# Patient Record
Sex: Female | Born: 1970 | Race: Black or African American | Hispanic: No | Marital: Married | Smoking: Never smoker
Health system: Southern US, Community
[De-identification: ages and names within clinical notes are randomized; demographics above are authoritative.]

## PROBLEM LIST (undated history)

## (undated) DIAGNOSIS — M79605 Pain in left leg: Secondary | ICD-10-CM

## (undated) DIAGNOSIS — M25552 Pain in left hip: Secondary | ICD-10-CM

## (undated) DIAGNOSIS — M25569 Pain in unspecified knee: Secondary | ICD-10-CM

## (undated) HISTORY — DX: Pain in unspecified knee: M25.569

## (undated) HISTORY — DX: Pain in left hip: M25.552

## (undated) HISTORY — PX: OTHER SURGICAL HISTORY: SHX169

## (undated) HISTORY — DX: Pain in left leg: M79.605

---

## 2005-06-21 ENCOUNTER — Ambulatory Visit: Payer: Self-pay | Admitting: *Deleted

## 2005-06-22 ENCOUNTER — Ambulatory Visit (HOSPITAL_COMMUNITY): Admission: RE | Admit: 2005-06-22 | Discharge: 2005-06-22 | Payer: Self-pay | Admitting: *Deleted

## 2008-03-19 ENCOUNTER — Encounter: Admission: RE | Admit: 2008-03-19 | Discharge: 2008-03-19 | Payer: Self-pay | Admitting: Neurological Surgery

## 2008-03-25 ENCOUNTER — Encounter: Admission: RE | Admit: 2008-03-25 | Discharge: 2008-03-25 | Payer: Self-pay | Admitting: Neurological Surgery

## 2008-07-29 ENCOUNTER — Inpatient Hospital Stay (HOSPITAL_COMMUNITY): Admission: AD | Admit: 2008-07-29 | Discharge: 2008-07-29 | Payer: Self-pay | Admitting: Obstetrics & Gynecology

## 2008-09-01 ENCOUNTER — Ambulatory Visit: Payer: Self-pay | Admitting: Obstetrics and Gynecology

## 2008-09-02 ENCOUNTER — Encounter: Payer: Self-pay | Admitting: Obstetrics and Gynecology

## 2008-09-02 LAB — CONVERTED CEMR LAB
RBC: 3.78 M/uL — ABNORMAL LOW (ref 3.87–5.11)
TSH: 5.808 microintl units/mL — ABNORMAL HIGH (ref 0.350–4.500)
WBC: 6.1 10*3/uL (ref 4.0–10.5)

## 2008-09-10 ENCOUNTER — Ambulatory Visit (HOSPITAL_COMMUNITY): Admission: RE | Admit: 2008-09-10 | Discharge: 2008-09-10 | Payer: Self-pay | Admitting: Obstetrics & Gynecology

## 2008-09-22 ENCOUNTER — Ambulatory Visit: Payer: Self-pay | Admitting: Obstetrics and Gynecology

## 2008-09-23 ENCOUNTER — Encounter: Payer: Self-pay | Admitting: Obstetrics & Gynecology

## 2008-09-23 LAB — CONVERTED CEMR LAB: T4, Total: 8.1 ug/dL (ref 5.0–12.5)

## 2008-10-29 ENCOUNTER — Ambulatory Visit: Payer: Self-pay | Admitting: Obstetrics and Gynecology

## 2008-12-10 ENCOUNTER — Ambulatory Visit: Payer: Self-pay | Admitting: Obstetrics and Gynecology

## 2008-12-10 LAB — CONVERTED CEMR LAB: TSH: 0.063 microintl units/mL — ABNORMAL LOW (ref 0.350–4.500)

## 2008-12-16 ENCOUNTER — Ambulatory Visit: Payer: Self-pay | Admitting: Obstetrics and Gynecology

## 2008-12-16 LAB — CONVERTED CEMR LAB: Free T4: 1.36 ng/dL (ref 0.80–1.80)

## 2009-01-26 ENCOUNTER — Ambulatory Visit: Payer: Self-pay | Admitting: Obstetrics & Gynecology

## 2009-01-26 ENCOUNTER — Encounter: Payer: Self-pay | Admitting: Obstetrics and Gynecology

## 2009-01-26 LAB — CONVERTED CEMR LAB: TSH: 0.557 microintl units/mL (ref 0.350–4.500)

## 2010-04-08 IMAGING — RF IR MYELOGRAM [PERSON_NAME]
12 of 22 series · 12 of 22 positions shown · non-contrast
Comparison: none

CLINICAL DATA: Back and neck pain.
TECHNIQUE: Contiguous axial images were obtained through the
Cervical spine without infusion. Coronal and sagittal
reconstructions were obtained of the axial image sets.
TECHNIQUE: Contiguous axial images were obtained through the lumbar
spine without infusion. Coronal, sagittal, and disc space

[Series 1: (hospital) · 1 of 1 slices shown]
[im 1/1]
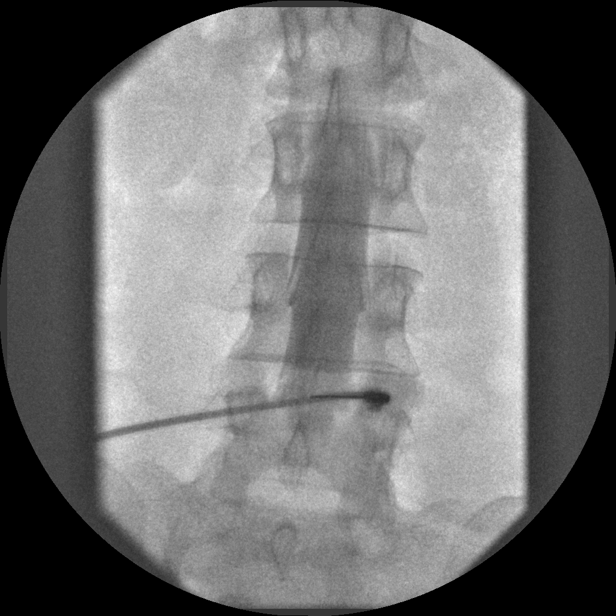

[Series 3: myelogram  white · 1 of 1 slices shown (1 of 11)]
[im 1/1]
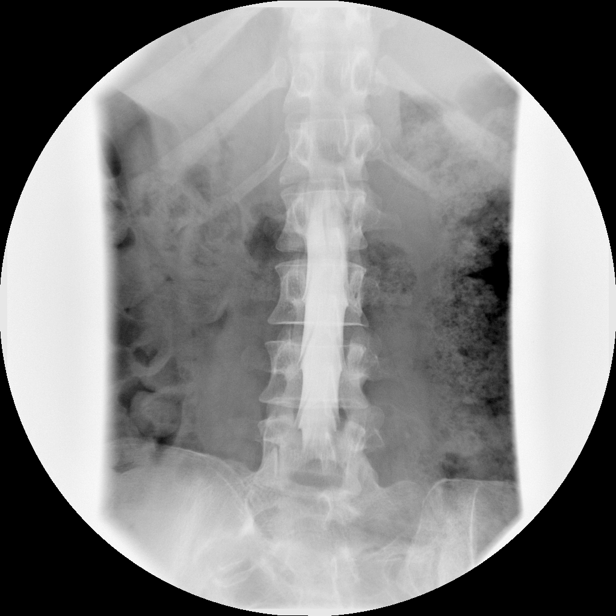

[Series 5: myelogram  white · 1 of 1 slices shown (2 of 11)]
[im 1/1]
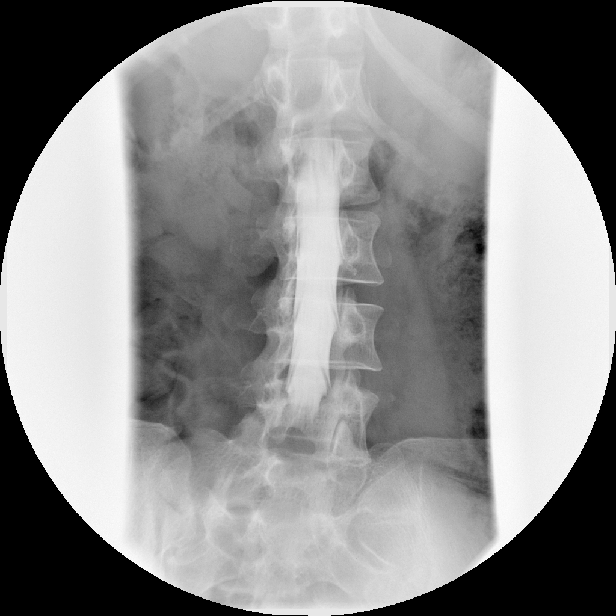

[Series 7: myelogram  white · 1 of 1 slices shown (3 of 11)]
[im 1/1]
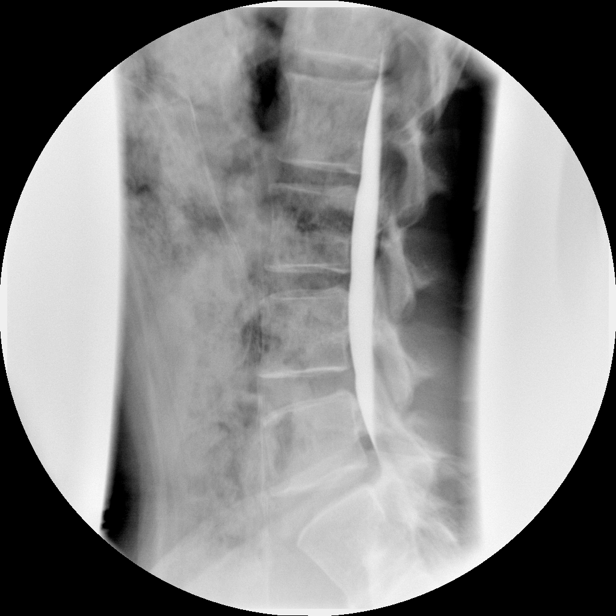

[Series 9: myelogram  white · 1 of 1 slices shown (4 of 11)]
[im 1/1]
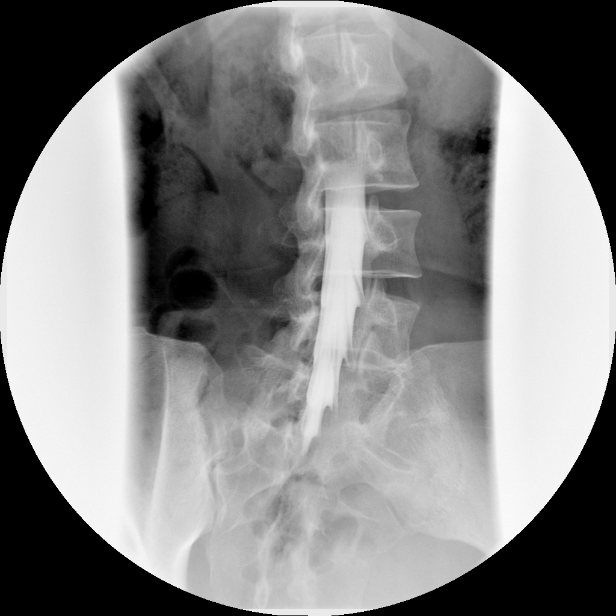

[Series 11: myelogram  white · 1 of 1 slices shown (5 of 11)]
[im 1/1]
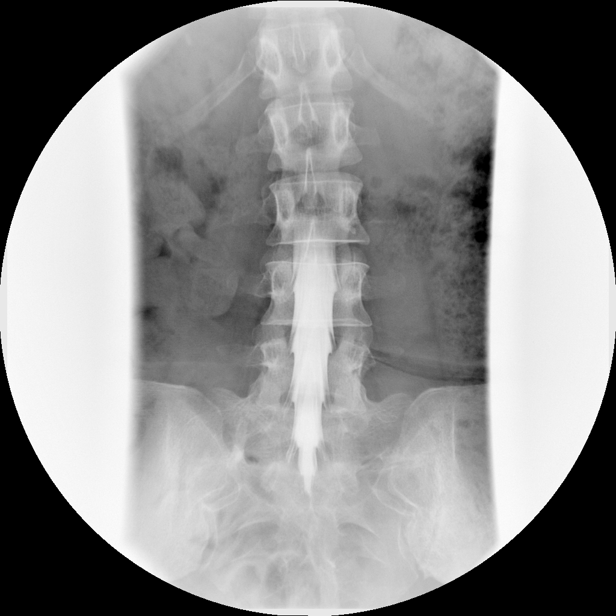

[Series 12: myelogram  white · 1 of 1 slices shown (6 of 11)]
[im 1/1]
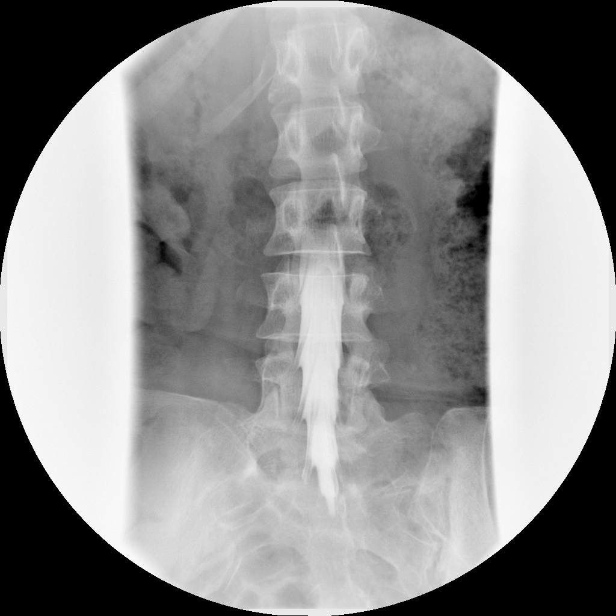

[Series 14: myelogram  white · 1 of 1 slices shown (7 of 11)]
[im 1/1]
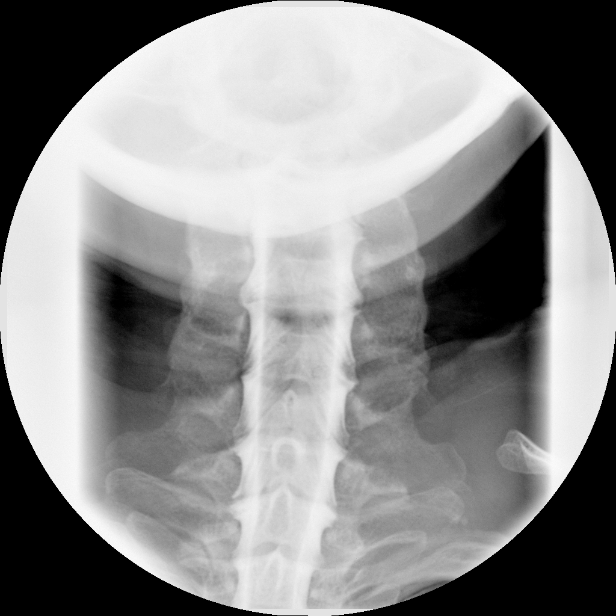

[Series 16: myelogram  white · 1 of 1 slices shown (8 of 11)]
[im 1/1]
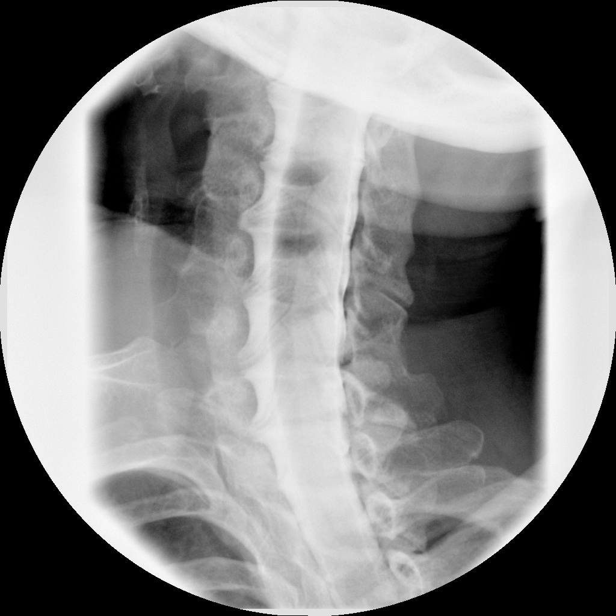

[Series 18: myelogram  white · 1 of 1 slices shown (9 of 11)]
[im 1/1]
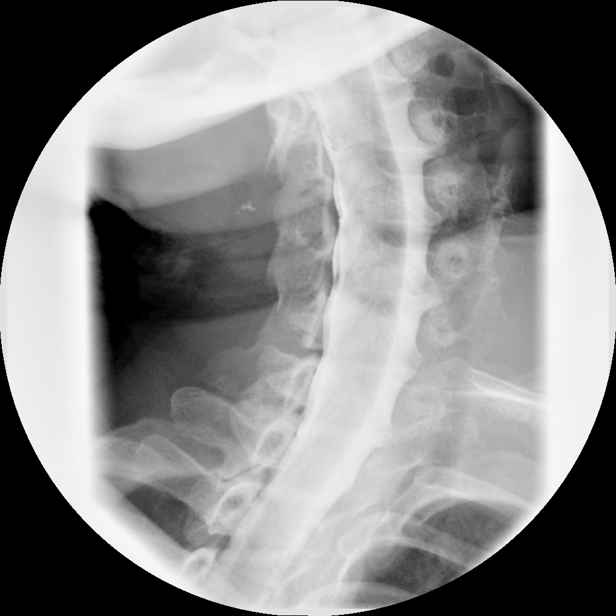

[Series 20: myelogram  white · 1 of 1 slices shown (10 of 11)]
[im 1/1]
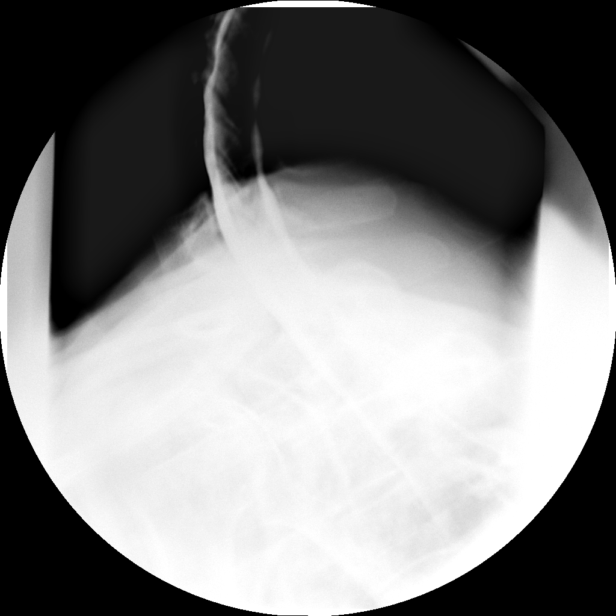

[Series 22: myelogram  white · 1 of 1 slices shown (11 of 11)]
[im 1/1]
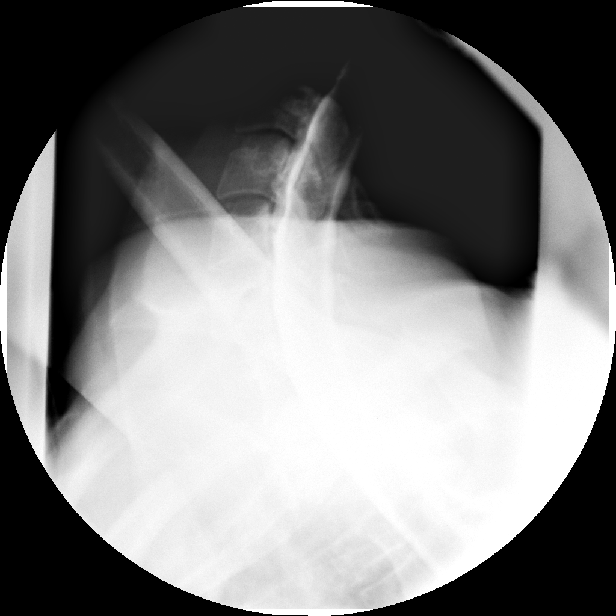

[12 of 22 positions shown; findings below may reference images not displayed]

LUMBAR PUNCTURE FOR CERVICAL MYELOGRAM

Procedure: After thorough discussion of risks and benefits of the
procedure including bleeding, infection, injury to nerves, blood
vessels, adjacent structures as well as headache and CSF leak,
written and oral informed consent was obtained.   Consent was
obtained by Dr.Adolfo.

Patient was positioned prone on the fluoroscopy table. Local
anesthesia was provided with 1% lidocaine without epinephrine after
prepped and draped in the usual sterile fashion. Puncture was
performed at L3-L4 using a three and one half inch 22-gauge spinal
needle via right paramedian approach.  Using a single pass through
the dura, the needle was placed within the thecal sac, with return
of clear CSF. 8 mL of Dmnipaque-ZPP was injected into the thecal
sac, with normal opacification of the nerve roots and cauda equina
consistent with free flow within the subarachnoid space.

Fluoroscopy time: 1 minute 57 seconds
FINDINGS: Cervical spinal alignment is anatomic.  No extradural
filling defects are identified.  Disc spaces preserved.
IMPRESSION: Negative cervical myelogram.

CT CERVICAL MYELOGRAM:
FINDINGS: Alignment of the cervical spine shows a mild
straightening of the mid cervical lordosis.  The cervical cord
demonstrates a normal caliber without enlargement or myelomalacia.
Bones demonstrate normal mineralization without fracture.  There is
a sclerotic lesion in the right T1 vertebral body adjacent to the
pedicle which at this margins that line with the adjacent
trabecula, most consistent with a large bone island.  This measures
6 mm by 6 mm.

Craniocervical alignment is normal.  Atlantodental interval
unremarkable.

C2-C3: Negative.

C3-C4:  Minimal central disc bulge without stenosis.

C4-C5:  Negative.

C5-C6:  Mild loss of disc height with minimal central bulging but
no stenosis.

C6-C7:  Negative.

C7-T1:  Negative.
IMPRESSION: Minimal cervical spine degenerative disease with small disc bulges
at C3-C4 and C5-C6 without stenosis.

LUMBAR MYELOGRAM
FINDINGS: Lumbosacral transitional anatomy is present with
extensive sacralization of the L5 vertebral body and transverse
process and a rudimentary L5-S1 intervertebral disc.  There is no
amputation of nerve roots on plain film myelography.  Mild
dextroconvex lumbar scoliosis is present which appears positional
on several images. Mild L4-L5 anterior epidural impression
consistent with shallow disc bulge.
IMPRESSION: 1.  Lumbosacral transitional anatomy with extensive sacralization
of the L5 vertebra which is partially fused on the left.
2.  No significant spondylolisthesis and minimal spondylosis with
shallow broad-based disc bulge at L3-L4.

CT LUMBAR MYELOGRAM
FINDINGS: The paraspinal soft tissues appear within normal limits.
Lumbosacral transitional anatomy is  with rudimentary L5-S1 disc
space.  The left L5 transverse processes fused to the sacrum.
Right L5 transverse process shows a pseudoarthrosis with the sacrum
with mild sclerotic changes which could be associated with painful
pseudoarthrosis or Bertolotti's syndrome.  At the time of CT
scanning, the alignment shows a minimal dextroconvex lumbar
scoliosis.  Spinal cord terminates posterior the L1 vertebral body.
T11-T12  is normal.

T12-L1: Negative.

L1-L2: Negative.

L2-L3: Negative.

L3-L4: Minimal shallow broad-based posterior disc bulge without
significant impression on the thecal sac.  Neural foramina and
lateral recesses as well as the central canal appear patent.
Facets within normal limits.

L4-L5: Degenerated disc with broad-based posterior disc bulge and
central moderate-sized protrusion.  This produces mass effect on
both lateral recesses, left greater than right.  This is best seen
on axial image number 61 series 3 and also on sagittal
reconstructions.  There is probably extrusion with cranial and
caudal extension measuring a few millimeters.  Mild central
stenosis.  No definite foraminal narrowing is present despite disc
bulging into both foramina.

L5-S1: Rudimentary disc with anterior disc calcification.  No
fusion is present at this level across the disc space.  Facet
joints are diminutive with some mild right ligamentum flavum
calcification.  Lateral recesses and central canal patent.
IMPRESSION: 1.  Lumbosacral transitional anatomy is present with four lumbar-
type non-rib bearing vertebral bodies and a lumbosacral
transitional vertebra which is termed L5.  This has extensive
sacralization of the transverse processes and is fused to the
sacrum on the left with pseudoarthrosis on the right.  Mild
sclerosis of the pseudoarthrosis which could be associated with
Bertolotti's syndrome.
2.  Moderate central disc protrusion, with probable extrusion at L4-
L5 and a few millimeters of cranial and caudal extension.
Bilateral lateral recess stenosis potentially effecting both L5
nerve roots in the lateral recesses.  Mild central stenosis.

## 2010-07-30 LAB — POCT URINALYSIS DIP (DEVICE)
Bilirubin Urine: NEGATIVE
Hgb urine dipstick: NEGATIVE
Nitrite: NEGATIVE
Specific Gravity, Urine: 1.015 (ref 1.005–1.030)

## 2010-08-01 LAB — POCT PREGNANCY, URINE: Preg Test, Ur: NEGATIVE

## 2010-08-02 LAB — URINALYSIS, ROUTINE W REFLEX MICROSCOPIC
Bilirubin Urine: NEGATIVE
Glucose, UA: NEGATIVE mg/dL
Hgb urine dipstick: NEGATIVE
Ketones, ur: NEGATIVE mg/dL
pH: 6 (ref 5.0–8.0)

## 2010-09-05 NOTE — Group Therapy Note (Signed)
NAMESHAMARA, Ann Estrada              ACCOUNT NO.:  [id number]   MEDICAL RECORD NO.:  [id number]          PATIENT TYPE:  WOC   LOCATION:  [facility]                   FACILITY:  [facility]   PHYSICIAN:  Allie Bossier, MD        DATE OF BIRTH:  03-10-1971   DATE OF SERVICE:  09/22/2008                                  CLINIC NOTE   HISTORY:  [name] is a 40 year old [place] lady who is G5, P 5,  who is here for follow up of her pelvic pain.  She says that beginning  around March, she has been having irregular vaginal bleeding, mostly  bleeding on a daily pattern, although it is very light.  Her hemoglobin  done just recently was 11.9.  She also complains of pain in the  suprapubic area.  On exam with Dr. Okey Dupre 09/01/2008, he describes her  uterus to be of normal size and shape, and normal consistency, but  quite tender to palpation.  An ultrasound was done which was described  as normal.  I agree with his assessment that she probably has  adenomyosis.  I have offered her and given her written information on  Depo-Provera and the Mirena IUD.  I have discussed a hysterectomy with  her.   At this time, she is going to go home and research these options.  She  will come back and let us know what she wants to do.  Please note that  she currently only takes an occasional Advil or Tylenol for shoulder  pain.  She says that helps somewhat with her pelvic pain, but she does  not take it on a regular basis.  I have assured her that she does not  have any obvious malignancies and that this condition can be observed.  Of note, her TSH was drawn on 09/01/2008 and was elevated at 5.8.  I  have ordered a full thyroid panel.  With further history taking, she  tells me that 3 years ago she was told she had a low thyroid and was  given thyroid medicine.  However, she has not taken that medicine and  does not seem excited about the prospect of taking medicine for it.  However, she has agreed to have the  blood work drawn today.      Allie Bossier, MD     MCD/MEDQ  D:  09/22/2008  T:  09/22/2008  Job:  [id number]

## 2010-09-05 NOTE — Group Therapy Note (Signed)
Ann Estrada, Ann Estrada              ACCOUNT NO.:  [id number]   MEDICAL RECORD NO.:  [id number]          PATIENT TYPE:  WOC   LOCATION:  [facility]                   FACILITY:  [facility]   PHYSICIAN:  Argentina Donovan, MD        DATE OF BIRTH:  28-Apr-1970   DATE OF SERVICE:  09/01/2008                                  CLINIC NOTE   The patient is a 40 year old [place] female who speaks English and has  been in this country for 19 years.  She is a gravida 5, para 5-0-0-5,  with her youngest child 23 years old.  Although she has had some problem  with constipation, having only a bowel movement every 2 days for many  years, recently her main problem of our concern is her gynecological  problem which is that she, starting in about March, has had almost  continual bleeding with maybe a day or to stopping.  The bleeding has  only required her to change her pad once or twice a day, but she has  also had lower abdominal pain for about 6 months.  When her child jumps  on her belly or whatever, it always is referred to the suprapubic area.  She was recently seen in the clinic and has been put on amoxicillin for  a throat infection.  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  It sounds as if she probably has a radiculopathy of the cervical  vertebra and also perhaps a sciatica.  Both of those things should be on  her right side, and she is seeing orthopedic doctors and is going now  for pain management.   As far as a gynecological examination goes, her abdomen is soft, flat,  somewhat tender but no guarding or rebound, and this is only to deep  palpation.  The abdominal wall is very soft, although, when she attempts  to sit up, it shows that she has fairly good musculature.  The pain is  mostly directly over the symphysis.  On examination pelvically,  external  genitalia is status female circumcision.  The introitus otherwise is  normal.  The vagina is clean with a moderate amount of blood in the  vault and well rugated, somewhat swollen at the anterior wall, but soft  and no sign of any abscess and no tenderness there.  The cervix is  parous and closed, and the uterus is of normal size, shape, consistency,  but quite tender to palpation.  Certainly no chandelier sign, but the  tenderness does seem to be uterine.  The adnexa is easily palpable but  does not seem to be significantly tender.  The cul-de-sac is free.   My initial impression is that this patient possibly has adenomyosis.  I  am going to get a CBC because she has had this constant bleeding.  She  had not taken any medication and she is going to start on some over-the-  counter iron until  I see her in 2 weeks.  Will get an ultrasound,  although unless it shows some evidence of adenomyosis, I do not expect  we are going to find very much on that.  I think that that may be our  diagnosis.  Will have her come back in 2 weeks and then decide what to  do.  She had taken birth control pills in the past but had epigastric  problems with them which went away when she stopped, so she has only  used condoms for contraception.  She has a good family relationship, a  caring husband and good friends, a good support system.  We will see her  back and decide where we go from there, whether we can treat this with  hormonal therapy or whether this lady is going to need a hysterectomy.  With the pain, I think that endometrial ablation is not the answer.           ______________________________  Argentina Donovan, MD    PR/MEDQ  D:  09/01/2008  T:  09/01/2008  Job:  [id number]

## 2010-09-05 NOTE — Group Therapy Note (Signed)
Ann Estrada, MILLINER              ACCOUNT NO.:  [id number]   MEDICAL RECORD NO.:  [id number]          PATIENT TYPE:  WOC   LOCATION:  [facility]                   FACILITY:  [facility]   PHYSICIAN:  Argentina Donovan, MD        DATE OF BIRTH:  Nov 17, 1970   DATE OF SERVICE:  10/29/2008                                  CLINIC NOTE   The patient is a 40 year old, [place] female, gravida 5, para 5-0-0-5,  who speaks English was seen recently because of chronic right lower  quadrant pain and menorrhagia and was given information on hysterectomy  and possible Mirena IUD by Dr. Marice Potter.  The patient comes back today and  to go over and decide what she wanted to do and since that time all her  pain has gone away as well as the bleeding.  I told we just observed  that.  In addition to this, Dr. Marice Potter had drawn thyroid profile on her.  She had a free T3 of 2.1 which was low and an elevated TSH of 5.8.  She  had previously been on thyroid in the past, but she said her doctor told  her to take it for a few months then stop it, so we are going to start  her on 1.25 of Synthroid, and see her have her come back for repeat  thyroid profile in 1 month.   IMPRESSION:  Hypothyroidism.           ______________________________  Argentina Donovan, MD     PR/MEDQ  D:  10/29/2008  T:  10/29/2008  Job:  [id number]

## 2010-09-08 NOTE — Group Therapy Note (Signed)
Ann Estrada, Ann Estrada              ACCOUNT NO.:  [id number]   MEDICAL RECORD NO.:  [id number]          PATIENT TYPE:  WOC   LOCATION:  [facility]                   FACILITY:  [facility]   PHYSICIAN:  Carolanne Grumbling, M.D.   DATE OF BIRTH:  05/10/1970   DATE OF SERVICE:  06/21/2005                                    CLINIC NOTE   Patient is a 40 year old G4, P4-0-0-4 here for pelvic pain.  She has been  told that she has endometriosis; however, no diagnostic procedure has ever  been done.  She reports that she has had abdominal pain off and on over the  last two years but it has gotten worse over the last year.  She describes it  as sharp, rates it a 6-10/10 when it happens.  She is not sure what makes it  better but it is the worst with sex.  Initially I misunderstood and thought  it was worse with her periods but she denies that.  She was started on oral  contraceptives in 2003 after having unplanned pregnancy and feels that after  stopping those pills approximately three months ago the pain has slightly  improved.  Reports that menarche was approximately at age 35 but she is not  sure.  Her menses were always very regular, to the point where she would use  the rhythm method until 2003 when she started oral contraceptives.  Last  three months when she has been off the pill her menses have been irregular.  In January she had a period from January 7 through January 13, then again  January 28 through February 1, then again February 5 through February 10.  Reports they are very heavy the first three days and then go to normal.  She  describes heavy as needing to change her pad approximately two hours and by  that point the pad is very full.  Once again, denies any pain with her  menses.   PAST MEDICAL HISTORY:  Denies any major medical problems.  However, when she  lived in [place] she reports as a child she had something on her left neck  that she had to be hospitalized for and it appears  that they made some sort  of incision.  I do not know if they took out a lymph node or what was done,  exactly.   GYN HISTORY:  Reports no abnormal Pap smears but she is not sure if she has  ever had one.  Denies any STDs.   PAST SURGICAL HISTORY:  Denies any.   FAMILY HISTORY:  Reports everybody is healthy and living.   REVIEW OF SYSTEMS:  14-point review of systems was negative.  Pertinent  positives as per the HPI.   SOCIAL HISTORY:  Patient is married and has four children that live at home  with her.  She is an immigrant from [place].  Did undergo a female  circumcision years ago.  Denies any alcohol or drug abuse.   MEDICATIONS:  None.   ALLERGIES:  No known drug allergies.   PHYSICAL EXAMINATION:  VITAL SIGNS:  Temperature 98.6, blood pressure 86/53,  pulse 73, weight 135.3, height 5 feet 4 inches.  GENERAL:  Well-developed, well-nourished female in no apparent distress.  HEENT:  Normocephalic, atraumatic.  Neck is supple.  She does have a well  healed incision just superior to her clavicle on the left side and inferior  to her left ear.  No lymphadenopathy appreciated.  CARDIOVASCULAR:  Regular rate and rhythm.  No murmurs appreciated.  LUNGS:  Clear to auscultation bilaterally.  Good air movement.  ABDOMEN:  Soft, nontender, nondistended.  No masses.  GENITOURINARY:  External genitalia significant for evidence of female  circumcision.  The clitoris is scarred over.  Vagina has pink rugae.  Cervix  is parous.  Bimanual reveals a normal sized uterus.  Adnexa are free of  masses and everything is freely moveable.  However, she did have pain with  the examination with the deep penetration on entry.  Vaginal rectal  examination was performed and no masses were appreciated.  She did have a  significant amount of stool in the vault.   ASSESSMENT/PLAN:  1.  Pelvic pain.  Her story is not consistent with endometriosis.  Other      things in the differential include possibly  some pelvic floor prolapse      that is making deep penetration uncomfortable or even adenomyosis.      However, with adenomyosis you would think that she would have more pain      with her menses.  Will go ahead and order a pelvic ultrasound and go      from there.  2.  Constipation.  Patient reports bowel movement only every three days and      it is hard.  This may also be contributing to #1 so we will start her on      Colace 100 mg p.o. b.i.d.  Follow up in two weeks.  Case discussed with      Dr. Okey Dupre.           ______________________________  Carolanne Grumbling, M.D.     TW/MEDQ  D:  06/21/2005  T:  06/22/2005  Job:  [id number]

## 2014-02-11 ENCOUNTER — Ambulatory Visit: Payer: Medicaid Other | Admitting: Neurology

## 2014-02-12 ENCOUNTER — Encounter: Payer: Self-pay | Admitting: Neurology

## 2014-02-12 ENCOUNTER — Ambulatory Visit (INDEPENDENT_AMBULATORY_CARE_PROVIDER_SITE_OTHER): Payer: Medicaid Other | Admitting: Neurology

## 2014-02-12 VITALS — BP 98/66 | HR 49 | Ht 63.0 in | Wt 134.0 lb

## 2014-02-12 DIAGNOSIS — M545 Low back pain, unspecified: Secondary | ICD-10-CM | POA: Insufficient documentation

## 2014-02-12 DIAGNOSIS — M79605 Pain in left leg: Secondary | ICD-10-CM | POA: Insufficient documentation

## 2014-02-12 MED ORDER — NORTRIPTYLINE HCL 25 MG PO CAPS: ORAL_CAPSULE | ORAL | Status: AC

## 2014-02-12 NOTE — Progress Notes (Signed)
PATIENT: Ann Estrada DOB: 07/04/1970  HISTORICAL  Ann Estrada is a [age] y RH AAF, referred by Dr. Edmon Crapeipton and her PCP Dr. Sandria SenterAsras for evaluation of left leg pain.  In July 15th 2015, while lifting a 50 Lb bag of potato at work, the bag sliding down, knocked her medial left knee, she has to kneel down on her right knee to get up, felt instant left knee pain, left hip pain raidating to left anterior thigh then. 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  She began see medical advise 2 weeks later, over the past few months, she had repeat left knee intrarticular injection, left epidural injection without helping her symptoms.  She has tried oral steroid tapering, tramadol, Mobic, Tylenol,methocarbamol 750 mg every night, celebrex 200mg  bid, gabapentin 100mg  tid 3 months, Robaxin 750mg  bid with out helping her symptoms.  She now complains of left hip,radiating pain to left anterior thigh, left knee pain,difficulty sleeping, walking because of the pain  MRI of her left knee was normalMRI of her lumbar was normal, with exception of shadow disc bulging at L5-S1, without canal or foraminal narrowing,  Left hip MRI was essentially normal, with exception of small benign synovial herniation pit in the anterior aspect of the left femoral head, and proximal neck, which was a normal variant.  Laboratory evaluation for autoimmune disorders was negative     REVIEW OF SYSTEMS: Full 14 system review of systems performed and notable only for weight loss, fatigue, feeling hot, cold, joint pain, swelling, cramps, aching muscles. ALLERGIES: Allergies  Allergen Reactions  . Baclofen Other (See Comments)    Headache    HOME MEDICATIONS: No current  outpatient prescriptions on file prior to visit.   No current facility-administered medications on file prior to visit.    PAST MEDICAL HISTORY: Past Medical History  Diagnosis Date  . Left leg pain   . Knee pain     Left  . Left hip pain     PAST SURGICAL HISTORY: Past Surgical History  Procedure Laterality Date  . None      FAMILY HISTORY: History reviewed. No pertinent family history.  SOCIAL HISTORY:  History   Social History  . Marital Status: Married    Spouse Name: N/A    Number of Children: 5  . Years of Education: 9th   Occupational History  . Not on file.   Social History Main Topics  . Smoking status: Never Smoker   . Smokeless tobacco: Never Used  . Alcohol Use: No  . Drug Use: No  . Sexual Activity: Not on file   Other Topics Concern  . Not on file   Social History Narrative   Patient lives at home with her husband and 5  Children.  Patient is not working at this time.   Education 9th    Right handed.   Caffeine none.     PHYSICAL EXAM   Filed Vitals:   02/12/14 1026  BP: 98/66  Pulse: 49  Height: 5\' 3"  (1.6 m)  Weight: 134 lb (60.782 kg)   Body mass index is 23.74 kg/(m^2).   Generalized: In no acute distress  Neck: Supple, no carotid bruits  Cardiac: Regular rate rhythm  Pulmonary: Clear to auscultation bilaterally  Musculoskeletal: No deformity  Neurological examination  Mentation: Alert oriented to time, place, history taking, and causual conversation  Cranial nerve II-XII: Pupils were equal round reactive to light. Extraocular movements were full.  Visual field were full on confrontational test. Bilateral fundi were sharp.  Facial sensation and strength were normal. Hearing was intact to finger rubbing bilaterally. Uvula tongue midline.  Head turning and shoulder shrug and were normal and symmetric.Tongue protrusion into cheek strength was normal.  Motor: she tends to guard her left leg, no significant weakness, but  exam is limited by her pain.  Sensory: Intact to fine touch, pinprick, preserved vibratory sensation, and proprioception at toes.  Coordination: Normal finger to nose, heel-to-shin bilaterally there was no truncal ataxia  Gait: limp, drag her left leg  Deep tendon reflexes: Brachioradialis 2/2, biceps 2/2, triceps 2/2, patellar 2/2, Achilles 2/2, plantar responses were flexor bilaterally.   DIAGNOSTIC DATA (LABS, IMAGING, TESTING) - I reviewed patient records, labs, notes, testing and imaging myself where available.  Lab Results  Component Value Date   WBC 6.1 09/02/2008   HGB 11.9* 09/02/2008   HCT 35.2* 09/02/2008   MCV 93.1 09/02/2008   PLT 278 09/02/2008   Lab Results  Component Value Date   TSH 0.557 01/26/2009      ASSESSMENT AND PLAN  Ann Estrada is a 43 y.o. female complains of left hip,anterior thigh, left knee, left leg pain, extensive evaluation detailed above was non revealing, fail to improve with different medication treatment,   1. DDx include complex regional pain syndrome  2. Complete evaluation with EMG/NCS to rule out left low extremity neuropathy 3. Add on Nortriptyline 25mg  ii po qhs. 4. Left lower extremity doppler study to rule out DVT.   Levert FeinsteinYijun Donnabelle Blanchard, M.D. Ph.D.  [facility] [address], [address] [place], [place] [address] [phone]

## 2014-02-22 ENCOUNTER — Ambulatory Visit (INDEPENDENT_AMBULATORY_CARE_PROVIDER_SITE_OTHER): Payer: Medicaid Other | Admitting: Neurology

## 2014-02-22 ENCOUNTER — Encounter (INDEPENDENT_AMBULATORY_CARE_PROVIDER_SITE_OTHER): Payer: Self-pay

## 2014-02-22 DIAGNOSIS — Z0289 Encounter for other administrative examinations: Secondary | ICD-10-CM

## 2014-02-22 DIAGNOSIS — M545 Low back pain, unspecified: Secondary | ICD-10-CM

## 2014-02-22 DIAGNOSIS — M79605 Pain in left leg: Secondary | ICD-10-CM

## 2014-02-22 NOTE — Procedures (Signed)
   NCS (NERVE CONDUCTION STUDY) WITH EMG (ELECTROMYOGRAPHY) REPORT   STUDY DATE: November second 2015 PATIENT NAME: Ann Estrada DOB: 05/16/1970 MRN: [id number]    TECHNOLOGIST:Lorraine Yetta BarreJones ELECTROMYOGRAPHER: Levert FeinsteinYan, Jefte Carithers M.D.  CLINICAL INFORMATION:  43 years old female, complains of worsening left knee, left hip pain, since minor injury to her left knee in July 2015,  FINDINGS: NERVE CONDUCTION STUDY:  Bilateral peroneal sensory responses were normal. Bilateral peroneal, tibial motor responses were normal. Bilateral tibial H reflexes were normal and symmetric.  NEEDLE ELECTROMYOGRAPHY:  Selected needle examination was performed at left lower extremity muscles, left lumbosacral paraspinal muscles.  Needle examination of left tibialis anterior, tibialis posterior, medial gastrocnemius, peroneal longus, vastus lateralis, biceps femoris long head was normal.  There was no spontaneous activity at left lumbosacral paraspinal muscles, left L4-L5 S1.  IMPRESSION:   This is a normal study. There is no electrodiagnostic evidence of large fiber peripheral neuropathy, or left lumbar radiculopathy.   INTERPRETING PHYSICIAN:   Levert FeinsteinYan, Jakhari Space M.D. Ph.D. [facility] [address], [address] [place], [place] [address] [phone]

## 2014-03-01 ENCOUNTER — Encounter: Payer: Self-pay | Admitting: Neurology

## 2014-03-01 ENCOUNTER — Other Ambulatory Visit (HOSPITAL_COMMUNITY): Payer: Self-pay | Admitting: Diagnostic Radiology

## 2014-03-01 ENCOUNTER — Ambulatory Visit (INDEPENDENT_AMBULATORY_CARE_PROVIDER_SITE_OTHER): Payer: Medicaid Other | Admitting: Neurology

## 2014-03-01 ENCOUNTER — Telehealth: Payer: Self-pay | Admitting: Neurology

## 2014-03-01 VITALS — BP 96/63 | HR 51 | Ht 63.0 in | Wt 134.0 lb

## 2014-03-01 DIAGNOSIS — M79605 Pain in left leg: Secondary | ICD-10-CM

## 2014-03-01 DIAGNOSIS — M545 Low back pain, unspecified: Secondary | ICD-10-CM

## 2014-03-01 NOTE — Telephone Encounter (Signed)
Dondra SpryGail from [facility] calling back to speak with Annabelle Harmanana regarding scheduling for patient, please return call and advise.

## 2014-03-01 NOTE — Progress Notes (Signed)
PATIENT: Ann Estrada DOB: 12/13/1970  HISTORICAL  Ann Estrada is a [age] y RH AAF, referred by Dr. Edmon Crapeipton and her PCP Dr. Sandria SenterAsras for evaluation of left leg pain.  In July 15th 2015, while lifting a 50 Lb bag of potato at work, the bag sliding down, knocked her medial left knee, she has to kneel down on her right knee to get up, felt instant left knee pain, left hip pain raidating to left anterior thigh then. 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  She began see medical advise 2 weeks later, over the past few months, she had repeat left knee intrarticular injection, left epidural injection without helping her symptoms.  She has tried oral steroid tapering, tramadol, Mobic, Tylenol,methocarbamol 750 mg every night, celebrex 200mg  bid, gabapentin 100mg  tid 3 months, Robaxin 750mg  bid with out helping her symptoms.  She now complains of left hip,radiating pain to left anterior thigh, left knee pain,difficulty sleeping, walking because of the pain  MRI of her left knee was normal. MRI of her lumbar was normal, with exception of shadow disc bulging at L5-S1, without canal or foraminal narrowing,  Left hip MRI was essentially normal, with exception of small benign synovial herniation pit in the anterior aspect of the left femoral head, and proximal neck, which was a normal variant.  Laboratory evaluation for autoimmune disorders was negative  UPDATE Nov 9th 2015: She still complains of constant left knee pain, sharp, throbbing pain, constant, more than 10, also complains of left calf tightness, left lateral leg numbness. I have reviewed all the above test result with her and her husband, including normal MRI left knee, left hip,  lumbar, emg/ncs.   REVIEW OF SYSTEMS: Full 14 system review of systems performed and notable only for weight loss, fatigue, feeling hot, cold, joint pain, swelling, cramps, aching muscles. ALLERGIES: Allergies  Allergen Reactions  . Baclofen Other (See Comments)    Headache    HOME MEDICATIONS: Current Outpatient Prescriptions on File Prior to Visit  Medication Sig Dispense Refill  . celecoxib (CELEBREX) 200 MG capsule Take 200 mg by mouth 2 (two) times daily.    Marland Kitchen. gabapentin (NEURONTIN) 100 MG capsule Take 100 mg by mouth 3 (three) times daily.    . Multiple Vitamin (MULTIVITAMIN) capsule Take by mouth daily.    . nortriptyline (PAMELOR) 25 MG capsule One po qhs xone week, then 2 tabs po qhs 60 capsule 11   No current facility-administered medications on file prior to visit.    PAST MEDICAL HISTORY: Past Medical History  Diagnosis Date  . Left leg pain   . Knee pain     Left  . Left hip pain     PAST SURGICAL HISTORY: Past Surgical History  Procedure Laterality Date  . None      FAMILY HISTORY: No family history on file.  SOCIAL HISTORY:  History   Social History  . Marital Status: Married    Spouse Name: N/A    Number of Children: 5  . Years of Education: 9th   Occupational History  . Not on file.   Social History Main Topics  . Smoking status: Never Smoker   . Smokeless tobacco:  Never Used  . Alcohol Use: No  . Drug Use: No  . Sexual Activity: Not on file   Other Topics Concern  . Not on file   Social History Narrative   Patient lives at home with her husband and 5  Children.  Patient is not working at this time.   Education 9th    Right handed.   Caffeine none.     PHYSICAL EXAM   Filed Vitals:   03/01/14 1137  BP: 96/63  Pulse: 51  Height: 5\' 3"  (1.6 m)  Weight: 134 lb (60.782 kg)   Body mass index is 23.74 kg/(m^2).   Generalized: In no acute distress  Neck: Supple, no carotid bruits   Cardiac: Regular rate  rhythm  Pulmonary: Clear to auscultation bilaterally  Musculoskeletal: No deformity  Neurological examination  Mentation: Alert oriented to time, place, history taking, and causual conversation  Cranial nerve II-XII: Pupils were equal round reactive to light. Extraocular movements were full.  Visual field were full on confrontational test. Bilateral fundi were sharp.  Facial sensation and strength were normal. Hearing was intact to finger rubbing bilaterally. Uvula tongue midline.  Head turning and shoulder shrug and were normal and symmetric.Tongue protrusion into cheek strength was normal.  Motor: she tends to guard her left leg, no significant weakness, but exam is limited by her pain.  Sensory: Intact to fine touch, pinprick, preserved vibratory sensation, and proprioception at toes.  Coordination: Normal finger to nose, heel-to-shin bilaterally there was no truncal ataxia  Gait: limp, drag her left leg  Deep tendon reflexes: Brachioradialis 2/2, biceps 2/2, triceps 2/2, patellar 2/2, Achilles 2/2, plantar responses were flexor bilaterally.   DIAGNOSTIC DATA (LABS, IMAGING, TESTING) - I reviewed patient records, labs, notes, testing and imaging myself where available.  Lab Results  Component Value Date   WBC 6.1 09/02/2008   HGB 11.9* 09/02/2008   HCT 35.2* 09/02/2008   MCV 93.1 09/02/2008   PLT 278 09/02/2008   Lab Results  Component Value Date   TSH 0.557 01/26/2009      ASSESSMENT AND PLAN  Ann Estrada is a 43 y.o. female complains of left hip,anterior thigh, left knee, left leg pain, extensive evaluation detailed above was non revealing, fail to improve with different medication treatment,   1. DDx include complex regional pain syndrome  2.  [place] of left leg to rule out vascular insufficiency, I will call her result. 3. Refer to pain managment  Levert FeinsteinYijun Suresh Audi, M.D. Ph.D.  [facility] [address], [address] [place], [place] [address] [phone]

## 2014-03-01 NOTE — Telephone Encounter (Signed)
Called Gail back from [facility] left her a message asking her to call me back.

## 2014-03-01 NOTE — Telephone Encounter (Signed)
Spoke to [place] and she is having Venous lower ext Monday 03-08-2014 and Ultra sound Arterial 03-09-2014. Patient needs approval fax approval back to [place] at [phone].

## 2014-03-02 NOTE — Telephone Encounter (Signed)
Venous Lower doppler and Arterial were both approved please see please see appt. Notes for approval numbers. [facility] to [facility].

## 2014-03-03 ENCOUNTER — Other Ambulatory Visit: Payer: Self-pay

## 2014-03-04 ENCOUNTER — Other Ambulatory Visit: Payer: Self-pay

## 2014-03-08 ENCOUNTER — Ambulatory Visit
Admission: RE | Admit: 2014-03-08 | Discharge: 2014-03-08 | Disposition: A | Discharge status: Home / Self Care | Payer: Self-pay | Source: Ambulatory Visit | Attending: Neurology | Admitting: Neurology

## 2014-03-08 DIAGNOSIS — M545 Low back pain, unspecified: Secondary | ICD-10-CM

## 2014-03-08 DIAGNOSIS — M79605 Pain in left leg: Secondary | ICD-10-CM

## 2014-03-08 NOTE — Progress Notes (Signed)
Quick Note:  Called and left patient a message Normal Left leg Doppler. ______

## 2014-03-09 ENCOUNTER — Ambulatory Visit
Admission: RE | Admit: 2014-03-09 | Discharge: 2014-03-09 | Disposition: A | Discharge status: Home / Self Care | Payer: Medicaid Other | Source: Ambulatory Visit | Attending: Neurology | Admitting: Neurology

## 2014-03-09 ENCOUNTER — Telehealth: Payer: Self-pay

## 2014-03-09 DIAGNOSIS — M545 Low back pain, unspecified: Secondary | ICD-10-CM

## 2014-03-09 DIAGNOSIS — M79605 Pain in left leg: Secondary | ICD-10-CM

## 2014-03-09 NOTE — Telephone Encounter (Signed)
I have called her, explained previous extensive evaluation result, this including normal MRI of lumbar, left hip, left knee, EMG nerve conduction study of left lower extremity, and Doppler study of left leg,  She continue complains of left knee, left leg pain, I have referred her to pain management,  Ann Estrada, please cancel her follow-up appointment with me in December second 2015.

## 2014-03-09 NOTE — Telephone Encounter (Signed)
Patient calling and states her left knee was done. Please ask Dr.Yan if she can order a test and have my left knee checked out.

## 2014-03-09 NOTE — Telephone Encounter (Signed)
Patient can be reached at [phone].

## 2014-03-09 NOTE — Telephone Encounter (Signed)
Cx patient's appt.

## 2014-03-09 NOTE — Telephone Encounter (Signed)
Patient calling to state that she wanted the ultrasound on her knee and not just her leg, please return call and advise.

## 2014-03-24 ENCOUNTER — Ambulatory Visit: Payer: Medicaid Other | Admitting: Neurology

## 2014-04-13 ENCOUNTER — Encounter: Payer: Self-pay | Admitting: Neurology

## 2017-12-03 ENCOUNTER — Encounter (HOSPITAL_BASED_OUTPATIENT_CLINIC_OR_DEPARTMENT_OTHER): Payer: Self-pay | Admitting: *Deleted

## 2017-12-03 ENCOUNTER — Emergency Department (HOSPITAL_BASED_OUTPATIENT_CLINIC_OR_DEPARTMENT_OTHER): Payer: Worker's Compensation

## 2017-12-03 ENCOUNTER — Emergency Department (HOSPITAL_BASED_OUTPATIENT_CLINIC_OR_DEPARTMENT_OTHER)
Admission: EM | Admit: 2017-12-03 | Discharge: 2017-12-03 | Discharge status: Absent without leave | Payer: Worker's Compensation | Source: Home / Self Care

## 2017-12-03 ENCOUNTER — Other Ambulatory Visit: Payer: Self-pay

## 2017-12-03 ENCOUNTER — Emergency Department (HOSPITAL_BASED_OUTPATIENT_CLINIC_OR_DEPARTMENT_OTHER)
Admission: EM | Admit: 2017-12-03 | Discharge: 2017-12-03 | Disposition: A | Discharge status: Home / Self Care | Payer: Worker's Compensation | Attending: Emergency Medicine | Admitting: Emergency Medicine

## 2017-12-03 DIAGNOSIS — Y929 Unspecified place or not applicable: Secondary | ICD-10-CM | POA: Diagnosis not present

## 2017-12-03 DIAGNOSIS — M542 Cervicalgia: Secondary | ICD-10-CM | POA: Diagnosis not present

## 2017-12-03 DIAGNOSIS — Z79899 Other long term (current) drug therapy: Secondary | ICD-10-CM | POA: Insufficient documentation

## 2017-12-03 DIAGNOSIS — W01198A Fall on same level from slipping, tripping and stumbling with subsequent striking against other object, initial encounter: Secondary | ICD-10-CM | POA: Diagnosis not present

## 2017-12-03 DIAGNOSIS — M79642 Pain in left hand: Secondary | ICD-10-CM | POA: Diagnosis not present

## 2017-12-03 DIAGNOSIS — Y990 Civilian activity done for income or pay: Secondary | ICD-10-CM | POA: Insufficient documentation

## 2017-12-03 DIAGNOSIS — R51 Headache: Secondary | ICD-10-CM | POA: Diagnosis present

## 2017-12-03 DIAGNOSIS — W19XXXA Unspecified fall, initial encounter: Secondary | ICD-10-CM

## 2017-12-03 DIAGNOSIS — Y9389 Activity, other specified: Secondary | ICD-10-CM | POA: Diagnosis not present

## 2017-12-03 MED ORDER — IBUPROFEN 800 MG PO TABS
800.0000 mg | ORAL_TABLET | Freq: Once | ORAL | Status: AC
  Administered 2017-12-03: 800 mg via ORAL
  Filled 2017-12-03: qty 1

## 2017-12-03 MED ORDER — ONDANSETRON 4 MG PO TBDP
4.0000 mg | ORAL_TABLET | Freq: Once | ORAL | Status: AC
  Administered 2017-12-03: 4 mg via ORAL
  Filled 2017-12-03: qty 1

## 2017-12-03 NOTE — ED Provider Notes (Signed)
[facility] EMERGENCY DEPARTMENT Provider Note  CSN: [id number] Arrival date & time: 12/03/17  1442    History   Chief Complaint Chief Complaint  Patient presents with  . Fall    HPI Ann Estrada is a 47 y.o. female with no significant medical history who presented to the ED following a fall. She reports being forced backward by the force of one of the machines at work. Patient states that she fell and hit her head on concrete. She also reports contacting her left wrist as well. Denies LOC or AMS. She currently complains of headache, dizziness, neck pain or left hand pain. Denies paresthesias, weakness, vision changes or N/V.   Past Medical History:  Diagnosis Date  . Knee pain    Left  . Left hip pain   . Left leg pain     Patient Active Problem List   Diagnosis Date Noted  . Left leg pain 02/12/2014  . Low back pain 02/12/2014    Past Surgical History:  Procedure Laterality Date  . None       OB History   None      Home Medications    Prior to Admission medications   Medication Sig Start Date End Date Taking? Authorizing Provider  celecoxib (CELEBREX) 200 MG capsule Take 200 mg by mouth 2 (two) times daily.    [provider]  gabapentin (NEURONTIN) 100 MG capsule Take 100 mg by mouth 3 (three) times daily.    [provider]  lidocaine (LIDODERM) 5 %  02/11/14   [provider]  methocarbamol (ROBAXIN) 750 MG tablet  02/05/14   [provider]  Multiple Vitamin (MULTIVITAMIN) capsule Take by mouth daily.    [provider]  nortriptyline (PAMELOR) 25 MG capsule One po qhs xone week, then 2 tabs po qhs 02/12/14   Levert FeinsteinYan, Yijun, MD    Family History No family history on file.  Social History Social History   Tobacco Use  . Smoking status: Never Smoker  . Smokeless tobacco: Never Used  Substance Use Topics  . Alcohol use: No  . Drug use: No     Allergies   Baclofen   Review of  Systems Review of Systems  Constitutional: Negative.   HENT: Negative.   Eyes: Negative for photophobia, pain and visual disturbance.  Respiratory: Negative.   Cardiovascular: Negative.   Gastrointestinal: Negative.   Musculoskeletal: Positive for arthralgias and neck pain. Negative for gait problem.  Skin: Negative.   Neurological: Positive for dizziness and headaches. Negative for weakness and numbness.  Psychiatric/Behavioral: Negative for confusion and decreased concentration.    Physical Exam Updated Vital Signs BP 91/63 (BP Location: Right Arm)   Pulse (!) 52   Temp 99.4 F (37.4 C) (Oral)   Resp 18   Ht 5\' 3"  (1.6 m)   Wt 61.2 kg   SpO2 100%   BMI 23.90 kg/m   Physical Exam  Constitutional: She is oriented to person, place, and time. Vital signs are normal. She appears well-developed and well-nourished. She is cooperative.  HENT:  Head: Normocephalic and atraumatic.  Eyes: Pupils are equal, round, and reactive to light. Conjunctivae, EOM and lids are normal.  Neck: Normal range of motion and full passive range of motion without pain. Neck supple. Spinous process tenderness present. No muscular tenderness present. Normal range of motion present.  Cardiovascular: Normal rate and regular rhythm.  No murmur heard. Pulmonary/Chest: Effort normal and breath sounds normal.  Musculoskeletal: Normal  range of motion.       Left hand: Normal sensation noted. Normal strength noted.  Upper extremities have full active and passive ROM with 5/5 strength. Palmar aspect of left hand is tender to palpation. No bony tenderness. Wrist and finger movements are normal.  Neurological: She is alert and oriented to person, place, and time. She has normal strength. No cranial nerve deficit or sensory deficit. She exhibits normal muscle tone. GCS eye subscore is 4. GCS verbal subscore is 5. GCS motor subscore is 6.  Reflex Scores:      Bicep reflexes are 2+ on the right side and 2+ on the left  side.      Brachioradialis reflexes are 2+ on the right side and 2+ on the left side. At times, patient had difficulty following instructions.  Skin: Skin is warm. Capillary refill takes less than 2 seconds. No abrasion, no bruising and no ecchymosis noted.  Nursing note and vitals reviewed.    ED Treatments / Results  Labs (all labs ordered are listed, but only abnormal results are displayed) Labs Reviewed - No data to display  EKG None  Radiology Dg Cervical Spine Complete  Result Date: 12/03/2017 CLINICAL DATA:  Neck pain due to a fall today with a blow to the back of the head. Initial encounter. EXAM: CERVICAL SPINE - COMPLETE 4+ VIEW COMPARISON:  None. FINDINGS: There is no evidence of cervical spine fracture or prevertebral soft tissue swelling. Alignment is normal. No other significant bone abnormalities are identified. IMPRESSION: Negative cervical spine radiographs. Electronically Signed   By: Drusilla Kanner M.D.   On: 12/03/2017 15:59   Ct Head Wo Contrast  Result Date: 12/03/2017 CLINICAL DATA:  Head trauma, subacute, neuro/cognitive deficit. Headache and posterior neck pain extending into the left shoulder. EXAM: CT HEAD WITHOUT CONTRAST TECHNIQUE: Contiguous axial images were obtained from the base of the skull through the vertex without intravenous contrast. COMPARISON:  None. FINDINGS: Brain: No acute infarct, hemorrhage, or mass lesion is present. The ventricles are of normal size. No significant extraaxial fluid collection is present. The brainstem and cerebellum are normal. Vascular: No hyperdense vessel or unexpected calcification. Skull: Calvarium is intact. No focal lytic or blastic lesions are present. Sinuses/Orbits: The paranasal sinuses and mastoid air cells are clear. Globes and orbits are within normal limits. IMPRESSION: Negative CT of the head. Electronically Signed   By: Marin Roberts M.D.   On: 12/03/2017 16:06    Procedures Procedures (including  critical care time)  Medications Ordered in ED Medications  ibuprofen (ADVIL,MOTRIN) tablet 800 mg (800 mg Oral Given 12/03/17 1630)  ondansetron (ZOFRAN-ODT) disintegrating tablet 4 mg (4 mg Oral Given 12/03/17 1630)     Initial Impression / Assessment and Plan / ED Course  Triage vital signs and the nursing notes have been reviewed.  Pertinent labs & imaging results that were available during care of the patient were reviewed and considered in medical decision making (see chart for details).  Patient presents following a mechanical fall. Patient endorses head trauma at the time of the incident. Denies LOC or AMS and current neuro complaint is dizziness. Neuro exam was grossly normal and there were no focal neuro deficits; however, at the behest of patient's work representative who was present a head CT was ordered. Patient also endorsed bony tenderness along cervical spine despite having good ROM.  Clinical Course as of Dec 03 1629  Tue Dec 03, 2017  1616 X-ray of c-spine was negative. No fractures or  spondylosis. CT head normal as well. No hemorrhages, skull fractures or areas of acute ischemia/infarct.   [GM]    Clinical Course User Index [GM] Erabella Kuipers, Sharyon MedicusGabrielle I, PA-C   Final Clinical Impressions(s) / ED Diagnoses  1. Fall. Associated injuries include neck pain and left hand pain. Education provided on OTC and supportive treatment for pain relief. Education provided on concussion symptoms.  Dispo: Home. After thorough clinical evaluation, this patient is determined to be medically stable and can be safely discharged with the previously mentioned treatment and/or outpatient follow-up/referral(s). At this time, there are no other apparent medical conditions that require further screening, evaluation or treatment.   Final diagnoses:  Fall, initial encounter  Neck pain  Left hand pain    ED Discharge Orders    None        Reva BoresMortis, Markeise Mathews I, PA-C 12/03/17 1631     Terrilee FilesButler, Michael C, MD 12/04/17 1157

## 2017-12-03 NOTE — Discharge Instructions (Addendum)
The imaging of your head and neck performed today looks good. There are no signs of skull fractures or brain bleeding. Your neck has no signs of fractures or slippages in the bones which is good.  Do not be surprised if you are feeling more sore tomorrow. You may use Tylenol and/or Ibuprofen for pain relief. Apply ice to your head and left hand may help as well.

## 2017-12-03 NOTE — ED Triage Notes (Signed)
She fell backward at work today. Injury to her neck, head, left shoulder and back.

## 2017-12-03 NOTE — ED Triage Notes (Signed)
Pt left an hour ago without being seen. She is back to be seen for fall at work. Pain to her neck, head, left shoulder and back.

## 2017-12-03 NOTE — ED Notes (Signed)
pts boss came states they have a certain place for there employees and have a drug screen done there
